# Patient Record
Sex: Male | Born: 1970 | Hispanic: No | Marital: Married | Smoking: Never smoker
Health system: Southern US, Community
[De-identification: ages and names within clinical notes are randomized; demographics above are authoritative.]

## PROBLEM LIST (undated history)

## (undated) DIAGNOSIS — E7800 Pure hypercholesterolemia, unspecified: Secondary | ICD-10-CM

## (undated) HISTORY — PX: NO PAST SURGERIES: SHX2092

## (undated) HISTORY — PX: OTHER SURGICAL HISTORY: SHX169

---

## 2009-11-25 ENCOUNTER — Encounter: Admission: RE | Admit: 2009-11-25 | Discharge: 2009-11-25 | Payer: Self-pay | Admitting: Infectious Diseases

## 2012-05-12 DIAGNOSIS — Z0000 Encounter for general adult medical examination without abnormal findings: Secondary | ICD-10-CM

## 2012-05-12 HISTORY — DX: Encounter for general adult medical examination without abnormal findings: Z00.00

## 2013-03-12 ENCOUNTER — Encounter (HOSPITAL_BASED_OUTPATIENT_CLINIC_OR_DEPARTMENT_OTHER): Payer: Self-pay | Admitting: Emergency Medicine

## 2013-03-12 ENCOUNTER — Emergency Department (HOSPITAL_BASED_OUTPATIENT_CLINIC_OR_DEPARTMENT_OTHER)
Admission: EM | Admit: 2013-03-12 | Discharge: 2013-03-12 | Disposition: A | Discharge status: Home / Self Care | Payer: 59 | Attending: Emergency Medicine | Admitting: Emergency Medicine

## 2013-03-12 ENCOUNTER — Other Ambulatory Visit: Payer: Self-pay

## 2013-03-12 ENCOUNTER — Emergency Department (HOSPITAL_BASED_OUTPATIENT_CLINIC_OR_DEPARTMENT_OTHER): Payer: 59

## 2013-03-12 DIAGNOSIS — R0789 Other chest pain: Secondary | ICD-10-CM | POA: Insufficient documentation

## 2013-03-12 DIAGNOSIS — E7800 Pure hypercholesterolemia, unspecified: Secondary | ICD-10-CM | POA: Insufficient documentation

## 2013-03-12 DIAGNOSIS — Z79899 Other long term (current) drug therapy: Secondary | ICD-10-CM | POA: Insufficient documentation

## 2013-03-12 DIAGNOSIS — R079 Chest pain, unspecified: Secondary | ICD-10-CM

## 2013-03-12 HISTORY — DX: Pure hypercholesterolemia, unspecified: E78.00

## 2013-03-12 LAB — TROPONIN I: Troponin I: <0.3 ng/mL (ref <0.30)

## 2013-03-12 NOTE — Discharge Instructions (Signed)
Chest Pain (Nonspecific) °It is often hard to give a specific diagnosis for the cause of chest pain. There is always a chance that your pain could be related to something serious, such as a heart attack or a blood clot in the lungs. You need to follow up with your caregiver for further evaluation. °CAUSES  °· Heartburn. °· Pneumonia or bronchitis. °· Anxiety or stress. °· Inflammation around your heart (pericarditis) or lung (pleuritis or pleurisy). °· A blood clot in the lung. °· A collapsed lung (pneumothorax). It can develop suddenly on its own (spontaneous pneumothorax) or from injury (trauma) to the chest. °· Shingles infection (herpes zoster virus). °The chest wall is composed of bones, muscles, and cartilage. Any of these can be the source of the pain. °· The bones can be bruised by injury. °· The muscles or cartilage can be strained by coughing or overwork. °· The cartilage can be affected by inflammation and become sore (costochondritis). °DIAGNOSIS  °Lab tests or other studies, such as X-rays, electrocardiography, stress testing, or cardiac imaging, may be needed to find the cause of your pain.  °TREATMENT  °· Treatment depends on what may be causing your chest pain. Treatment may include: °· Acid blockers for heartburn. °· Anti-inflammatory medicine. °· Pain medicine for inflammatory conditions. °· Antibiotics if an infection is present. °· You may be advised to change lifestyle habits. This includes stopping smoking and avoiding alcohol, caffeine, and chocolate. °· You may be advised to keep your head raised (elevated) when sleeping. This reduces the chance of acid going backward from your stomach into your esophagus. °· Most of the time, nonspecific chest pain will improve within 2 to 3 days with rest and mild pain medicine. °HOME CARE INSTRUCTIONS  °· If antibiotics were prescribed, take your antibiotics as directed. Finish them even if you start to feel better. °· For the next few days, avoid physical  activities that bring on chest pain. Continue physical activities as directed. °· Do not smoke. °· Avoid drinking alcohol. °· Only take over-the-counter or prescription medicine for pain, discomfort, or fever as directed by your caregiver. °· Follow your caregiver's suggestions for further testing if your chest pain does not go away. °· Keep any follow-up appointments you made. If you do not go to an appointment, you could develop lasting (chronic) problems with pain. If there is any problem keeping an appointment, you must call to reschedule. °SEEK MEDICAL CARE IF:  °· You think you are having problems from the medicine you are taking. Read your medicine instructions carefully. °· Your chest pain does not go away, even after treatment. °· You develop a rash with blisters on your chest. °SEEK IMMEDIATE MEDICAL CARE IF:  °· You have increased chest pain or pain that spreads to your arm, neck, jaw, back, or abdomen. °· You develop shortness of breath, an increasing cough, or you are coughing up blood. °· You have severe back or abdominal pain, feel nauseous, or vomit. °· You develop severe weakness, fainting, or chills. °· You have a fever. °THIS IS AN EMERGENCY. Do not wait to see if the pain will go away. Get medical help at once. Call your local emergency services (911 in [place] Do not drive yourself to the hospital. °MAKE SURE YOU:  °· Understand these instructions. °· Will watch your condition. °· Will get help right away if you are not doing well or get worse. °Document Released: 10/18/2004 Document Revised: 04/02/2011 Document Reviewed: 08/14/2007 °[employer]® Patient Information ©2014 [employer]   [employer]. ° °

## 2013-03-12 NOTE — ED Provider Notes (Signed)
CSN: [id number]     Arrival date & time 03/12/13  1231 History   First MD Initiated Contact with Patient 03/12/13 1239     Chief Complaint  Patient presents with  . Chest Pain    (Consider location/radiation/quality/duration/timing/severity/associated sxs/prior Treatment) HPI Comments: Patient is a 43 year old male with a history of hyperlipidemia who presents to the emergency department for right-sided chest pain. Patient states he has been experiencing the pain for 4 years sporadically. He states that he notices the pain mostly when he has 2 consecutive days with less than 7 hours of sleep. He states he was recently experienced the pain this morning. He describes the pain as a sharp, squeezing, pressure like pain that is nonradiating. He denies any modifying factors of his symptoms and states that his pain lasts for approximately one to 2 minutes before spontaneously resolving. Patient denies associated fever, syncope, shortness of breath, jaw pain or neck pain, nausea or vomiting, numbness/tingling, and extremity weakness. Patient denies a history of diabetes mellitus, hypertension, ACS, and PE/DVT. He endorses a history of a heart attack in his maternal grandmother at the age of 6 as well as his maternal uncle in his mid 50s.  Patient is a 43 y.o. male presenting with chest pain. The history is provided by the patient. No language interpreter was used.  Chest Pain Associated symptoms: no cough, no fever, no nausea, no numbness, no shortness of breath, not vomiting and no weakness     Past Medical History  Diagnosis Date  . Hypercholesteremia    History reviewed. No pertinent past surgical history. History reviewed. No pertinent family history. History  Substance Use Topics  . Smoking status: Never Smoker   . Smokeless tobacco: Not on file  . Alcohol Use: Not on file    Review of Systems  Constitutional: Negative for fever.  Respiratory: Negative for cough and shortness of breath.    Cardiovascular: Positive for chest pain.  Gastrointestinal: Negative for nausea and vomiting.  Neurological: Negative for weakness and numbness.  All other systems reviewed and are negative.      Allergies  Review of patient's allergies indicates no known allergies.  Home Medications   Current Outpatient Rx  Name  Route  Sig  Dispense  Refill  . simvastatin (ZOCOR) 10 MG tablet   Oral   Take 10 mg by mouth daily.          BP 144/90  Pulse 78  Temp(Src) 98.2 F (36.8 C) (Oral)  Resp 17  SpO2 100%  Physical Exam  Nursing note and vitals reviewed. Constitutional: He is oriented to person, place, and time. He appears well-developed and well-nourished. No distress.  HENT:  Head: Normocephalic and atraumatic.  Eyes: Conjunctivae and EOM are normal. No scleral icterus.  Neck: Normal range of motion. Neck supple.  Cardiovascular: Normal rate, regular rhythm, normal heart sounds and intact distal pulses.   Distal radial pulses 2+ bilaterally. No JVD.  Pulmonary/Chest: Effort normal and breath sounds normal. No respiratory distress. He has no wheezes. He has no rales.  Abdominal: Soft. He exhibits no distension. There is no tenderness. There is no rebound and no guarding.  Musculoskeletal: Normal range of motion.  Neurological: He is alert and oriented to person, place, and time.  Skin: Skin is warm and dry. No rash noted. He is not diaphoretic. No erythema. No pallor.  Psychiatric: He has a normal mood and affect. His behavior is normal.    ED Course  Procedures (including critical  care time) Labs Review Labs Reviewed  TROPONIN I   Imaging Review Dg Chest 2 View  03/12/2013   CLINICAL DATA:  Chest pain  EXAM: CHEST  2 VIEW  COMPARISON:  11/25/2009  FINDINGS: The cardiac shadow is within normal limits. The lungs are well aerated bilaterally. In the left perihilar region there is a rounded soft tissue density identified which is likely related to prominent vascularity  although the possibility of an underlying lesion cannot be totally excluded despite the patient's given age. No bony abnormality is seen. The lungs are otherwise clear.  IMPRESSION: Left perihilar density likely related to a confluence of vascular parenchymal shadows. CT of the chest with contrast would be helpful for further evaluation.   Electronically Signed   By: Alcide CleverMark  Lukens M.D.   On: 03/12/2013 13:37    EKG Interpretation   None        Date: 03/12/2013  Rate: 71  Rhythm: normal sinus rhythm  QRS Axis: normal  Intervals: normal  ST/T Wave abnormalities: nonspecific T wave changes  Conduction Disutrbances:none  Narrative Interpretation: NSR with nonspecific T wave changes  Old EKG Reviewed: none available I have personally reviewed and interpreted this EKG   MDM   Final diagnoses:  Chest pain   43 year old male presents for chest pain which has been sporadic over the last 4 years. Patient as well and nontoxic appearing, hemodynamically stable, and afebrile. Symptoms atypical for chest pain as they are right-sided and brought on mostly when patient does not get enough sleep. Cardiac workup today is negative. Doubt ACS given chronicity of symptoms, atypical nature of pain, and reassuring physical exam and workup today. Patient also with heart score of 1 correlating with low risk of major cardiac events. Doubt pulmonary embolism is patient without tachycardia, tachypnea, dyspnea, or hypoxia. X-ray shows no evidence of the external widening, pneumothorax, pleural effusion, or focal consolidation/pneumonia. It does reveal a left perihilar density; however, patient's pain is right-sided. Density thought to be related to confluence of vascular parenchymal shadows.  Patient stable and appropriate for discharge today with instruction to followup with cardiology for further evaluation of his symptoms and to schedule an outpatient stress test. Return precautions provided and patient agreeable  to plan with no unaddressed concerns.   Filed Vitals:   03/12/13 1240  BP: 144/90  Pulse: 78  Temp: 98.2 F (36.8 C)  TempSrc: Oral  Resp: 17  SpO2: 100%         Antony MaduraKelly Cabot Cromartie, PA-C 03/12/13 1441

## 2013-03-12 NOTE — ED Notes (Signed)
Pt amb to room 1 with quick steady gait in nad. Pt reports right sided chest pain "for years, about every 6 months or so..." pt states pain increases when he does not get enough sleep. Pt denies any difference in the pain today, states "I just wanted to get it looked at.Marland Kitchen.Marland Kitchen."

## 2013-03-12 NOTE — ED Notes (Signed)
Room 1 is empty, pt seen leaving er with quick steady gait in nad by Ola SpurrLori Doss, RN. Pt left before receiving or signing for his discharge papers.

## 2013-03-12 NOTE — ED Provider Notes (Signed)
Medical screening examination/treatment/procedure(s) were performed by non-physician practitioner and as supervising physician I was immediately available for consultation/collaboration.  EKG Interpretation   None         Quianna Avery, MD 03/12/13 1530 

## 2013-03-12 NOTE — ED Notes (Signed)
Pt coughs, states "there it is again...now it's gone." pt states the pain also comes "when I have to strain to use the toilet.Marland Kitchen."

## 2013-03-20 ENCOUNTER — Encounter: Payer: 59 | Admitting: Cardiovascular Disease

## 2013-04-21 ENCOUNTER — Ambulatory Visit (INDEPENDENT_AMBULATORY_CARE_PROVIDER_SITE_OTHER): Payer: 59 | Admitting: Cardiovascular Disease

## 2013-04-21 ENCOUNTER — Encounter: Payer: Self-pay | Admitting: Cardiovascular Disease

## 2013-04-21 VITALS — BP 130/80 | HR 68 | Ht 68.0 in | Wt 194.0 lb

## 2013-04-21 DIAGNOSIS — R9389 Abnormal findings on diagnostic imaging of other specified body structures: Secondary | ICD-10-CM

## 2013-04-21 DIAGNOSIS — R079 Chest pain, unspecified: Secondary | ICD-10-CM | POA: Insufficient documentation

## 2013-04-21 DIAGNOSIS — R918 Other nonspecific abnormal finding of lung field: Secondary | ICD-10-CM

## 2013-04-21 HISTORY — DX: Chest pain, unspecified: R07.9

## 2013-04-21 NOTE — Patient Instructions (Signed)
Your physician recommends that you schedule a follow-up appointment as needed with Dr. McAlhany   

## 2013-04-21 NOTE — Progress Notes (Signed)
     History of Present Illness: 43 yo male with history of HLD who is here today as a new patient for evaluation of chest pain. He was seen in the ED at [facility] 03/12/13 with c/o right sided chest pain. EKG showed NSR with non-specific T wave flattening. He tells me that he had chest pain last month with two episodes lasting for several minutes. He has had no recurrence of the chest pain over the last month. He feels well. No SOB. He does have a cough but no fevers. Present since December. CXR in the ED 03/12/13 with ? Density left lung. Read as probable confluence of vascular parenchymal shadows.   Primary Care Physician: Deneen HartsElizabeth Todd  Past Medical History  Diagnosis Date  . Hypercholesteremia     Past Surgical History  Procedure Laterality Date  . None      Current Outpatient Prescriptions  Medication Sig Dispense Refill  . simvastatin (ZOCOR) 10 MG tablet Take 10 mg by mouth daily.       No current facility-administered medications for this visit.    No Known Allergies  History   Social History  . Marital Status: Married    Spouse Name: N/A    Number of Children: 1  . Years of Education: N/A   Occupational History  . Computer work    Social History Main Topics  . Smoking status: Never Smoker   . Smokeless tobacco: Not on file  . Alcohol Use: No  . Drug Use: No  . Sexual Activity: Not on file   Other Topics Concern  . Not on file   Social History Narrative  . No narrative on file    Family History  Problem Relation Age of Onset  . Heart attack Maternal Grandmother   . Heart disease Maternal Uncle     Review of Systems:  As stated in the HPI and otherwise negative.   BP 130/80  Pulse 68  Ht 5\' 8"  (1.727 m)  Wt 194 lb (87.998 kg)  BMI 29.50 kg/m2  Physical Examination: General: Well developed, well nourished, NAD HEENT: OP clear, mucus membranes moist SKIN: warm, dry. No rashes. Neuro: No focal deficits Musculoskeletal: Muscle strength 5/5 all  ext Psychiatric: Mood and affect normal Neck: No JVD, no carotid bruits, no thyromegaly, no lymphadenopathy. Lungs:Clear bilaterally, no wheezes, rhonci, crackles Cardiovascular: Regular rate and rhythm. No murmurs, gallops or rubs. Abdomen:Soft. Bowel sounds present. Non-tender.  Extremities: No lower extremity edema. Pulses are 2 + in the bilateral DP/PT.  EKG: NSR, rate 68 bpm.   Assessment and Plan:   1. Chest pain: Atypical and resolved. Non-cardiac pain. EKG normal. No further workup.   2. Abnormal chest x-ray in ED: Will need f/u CXR per primary care. Also f/u in primary care for cough.

## 2014-07-03 ENCOUNTER — Emergency Department (HOSPITAL_BASED_OUTPATIENT_CLINIC_OR_DEPARTMENT_OTHER): Payer: 59

## 2014-07-03 ENCOUNTER — Emergency Department (HOSPITAL_BASED_OUTPATIENT_CLINIC_OR_DEPARTMENT_OTHER)
Admission: EM | Admit: 2014-07-03 | Discharge: 2014-07-03 | Disposition: A | Discharge status: Home / Self Care | Payer: 59 | Attending: Emergency Medicine | Admitting: Emergency Medicine

## 2014-07-03 ENCOUNTER — Other Ambulatory Visit: Payer: Self-pay

## 2014-07-03 ENCOUNTER — Encounter (HOSPITAL_BASED_OUTPATIENT_CLINIC_OR_DEPARTMENT_OTHER): Payer: Self-pay | Admitting: *Deleted

## 2014-07-03 DIAGNOSIS — E780 Pure hypercholesterolemia: Secondary | ICD-10-CM | POA: Diagnosis not present

## 2014-07-03 DIAGNOSIS — Y9289 Other specified places as the place of occurrence of the external cause: Secondary | ICD-10-CM | POA: Insufficient documentation

## 2014-07-03 DIAGNOSIS — X58XXXA Exposure to other specified factors, initial encounter: Secondary | ICD-10-CM | POA: Insufficient documentation

## 2014-07-03 DIAGNOSIS — S29012A Strain of muscle and tendon of back wall of thorax, initial encounter: Secondary | ICD-10-CM | POA: Insufficient documentation

## 2014-07-03 DIAGNOSIS — Y998 Other external cause status: Secondary | ICD-10-CM | POA: Diagnosis not present

## 2014-07-03 DIAGNOSIS — S239XXA Sprain of unspecified parts of thorax, initial encounter: Secondary | ICD-10-CM | POA: Insufficient documentation

## 2014-07-03 DIAGNOSIS — Z79899 Other long term (current) drug therapy: Secondary | ICD-10-CM | POA: Diagnosis not present

## 2014-07-03 DIAGNOSIS — S299XXA Unspecified injury of thorax, initial encounter: Secondary | ICD-10-CM | POA: Diagnosis present

## 2014-07-03 DIAGNOSIS — S233XXA Sprain of ligaments of thoracic spine, initial encounter: Secondary | ICD-10-CM

## 2014-07-03 DIAGNOSIS — Y9389 Activity, other specified: Secondary | ICD-10-CM | POA: Insufficient documentation

## 2014-07-03 DIAGNOSIS — R52 Pain, unspecified: Secondary | ICD-10-CM

## 2014-07-03 LAB — BASIC METABOLIC PANEL
Anion gap: 3 — ABNORMAL LOW (ref 5–15)
BUN: 14 mg/dL (ref 6–20)
CHLORIDE: 108 mmol/L (ref 101–111)
CO2: 27 mmol/L (ref 22–32)
Calcium: 8.5 mg/dL — ABNORMAL LOW (ref 8.9–10.3)
Creatinine, Ser: 0.96 mg/dL (ref 0.61–1.24)
GFR calc Af Amer: >60 mL/min (ref >60)
GLUCOSE: 102 mg/dL — AB (ref 65–99)
Potassium: 3.9 mmol/L (ref 3.5–5.1)
Sodium: 138 mmol/L (ref 135–145)

## 2014-07-03 LAB — CBC WITH DIFFERENTIAL/PLATELET
Basophils Absolute: 0.1 10*3/uL (ref 0.0–0.1)
Basophils Relative: 1 % (ref 0–1)
EOS ABS: 0.4 10*3/uL (ref 0.0–0.7)
EOS PCT: 4 % (ref 0–5)
HCT: 44.3 % (ref 39.0–52.0)
HEMOGLOBIN: 14.8 g/dL (ref 13.0–17.0)
LYMPHS ABS: 3.6 10*3/uL (ref 0.7–4.0)
Lymphocytes Relative: 35 % (ref 12–46)
MCH: 27.3 pg (ref 26.0–34.0)
MCHC: 33.4 g/dL (ref 30.0–36.0)
MCV: 81.6 fL (ref 78.0–100.0)
MONO ABS: 1 10*3/uL (ref 0.1–1.0)
MONOS PCT: 10 % (ref 3–12)
NEUTROS ABS: 5.3 10*3/uL (ref 1.7–7.7)
Neutrophils Relative %: 51 % (ref 43–77)
PLATELETS: 210 10*3/uL (ref 150–400)
RBC: 5.43 MIL/uL (ref 4.22–5.81)
RDW: 13 % (ref 11.5–15.5)
WBC: 10.4 10*3/uL (ref 4.0–10.5)

## 2014-07-03 LAB — TROPONIN I

## 2014-07-03 MED ORDER — IBUPROFEN 800 MG PO TABS
800.0000 mg | ORAL_TABLET | Freq: Once | ORAL | Status: AC
  Administered 2014-07-03: 800 mg via ORAL
  Filled 2014-07-03: qty 1

## 2014-07-03 MED ORDER — METHOCARBAMOL 500 MG PO TABS: 1000.0000 mg | ORAL_TABLET | Freq: Four times a day (QID) | ORAL | Status: DC | PRN

## 2014-07-03 NOTE — ED Notes (Signed)
PA back at bedside for disposition 

## 2014-07-03 NOTE — ED Notes (Signed)
Patient c/o back pain that extends to chest that started 30 minutes ago when twisting his upper torso, hurts to twist upper body

## 2014-07-03 NOTE — Discharge Instructions (Signed)
For pain control you may take up to 800mg of Motrin (also known as ibuprofen). That is usually 4 over the counter pills,  3 times a day. Take with food to minimize stomach irritation   ° °For breakthrough pain you may take Robaxin. Do not drink alcohol, drive or operate heavy machinery when taking Robaxin. ° °Please follow with your primary care doctor in the next 2 days for a check-up. They must obtain records for further management.  ° °Do not hesitate to return to the Emergency Department for any new, worsening or concerning symptoms.  ° ° °

## 2014-07-03 NOTE — ED Provider Notes (Signed)
CSN: [id number]     Arrival date & time 07/03/14  1459 History   First MD Initiated Contact with Patient 07/03/14 1513     Chief Complaint  Patient presents with  . Back Pain     (Consider location/radiation/quality/duration/timing/severity/associated sxs/prior Treatment) HPI   Tom Hamilton is a 44 y.o. male past medical history significant for high cholesterol complaining of acute onset of left thoracic back pain onset when patient was twisting putting food into a microwave, he states that the pain happened approximately 30 minutes prior to arrival, he rates it at 4 out of 10, states it's exacerbated by movement and sitting. Pain radiates around to the left anterior chest. No pain medication taken prior to arrival. He denies associated symptoms of shortness of breath, N/V, diaphoresis, history of tobacco use, diabetes, hypertension. Patient states that he is concerned that he is having a heart attack. +FH of ACS.   Past Medical History  Diagnosis Date  . Hypercholesteremia    Past Surgical History  Procedure Laterality Date  . None     Family History  Problem Relation Age of Onset  . Heart attack Maternal Grandmother   . Heart disease Maternal Uncle    History  Substance Use Topics  . Smoking status: Never Smoker   . Smokeless tobacco: Not on file  . Alcohol Use: No    Review of Systems  10 systems reviewed and found to be negative, except as noted in the HPI.  Allergies  Review of patient's allergies indicates no known allergies.  Home Medications   Prior to Admission medications   Medication Sig Start Date End Date Taking? Authorizing Provider  MELOXICAM PO Take by mouth.   Yes Historical Provider, MD  methocarbamol (ROBAXIN) 500 MG tablet Take 2 tablets (1,000 mg total) by mouth 4 (four) times daily as needed (Pain). 07/03/14   Leiya Keesey, PA-C  simvastatin (ZOCOR) 10 MG tablet Take 10 mg by mouth daily.    Historical Provider, MD   BP 116/77 mmHg   Pulse 67  Temp(Src) 98.2 F (36.8 C) (Oral)  Resp 18  Ht  (1.727 m)  Wt 200 lb (90.719 kg)  BMI 30.42 kg/m2  SpO2 98% Physical Exam  Constitutional: He is oriented to person, place, and time. He appears well-developed and well-nourished. No distress.  HENT:  Head: Normocephalic and atraumatic.  Mouth/Throat: Oropharynx is clear and moist.  Eyes: Conjunctivae and EOM are normal. Pupils are equal, round, and reactive to light.  Neck: Normal range of motion.  Cardiovascular: Normal rate, regular rhythm and intact distal pulses.   Pulmonary/Chest: Effort normal and breath sounds normal. No stridor. No respiratory distress. He has no wheezes. He has no rales. He exhibits no tenderness.  Abdominal: Soft. Bowel sounds are normal. There is no tenderness.  Musculoskeletal: Normal range of motion.  Full range of motion to shoulder. Patient is mildly diffusely tender to palpation along the left posterior thorax. Lung sounds are clear to auscultation bilaterally.  Neurological: He is alert and oriented to person, place, and time.  Psychiatric: He has a normal mood and affect.  Nursing note and vitals reviewed.   ED Course  Procedures (including critical care time) Labs Review Labs Reviewed  BASIC METABOLIC PANEL - Abnormal; Notable for the following:    Glucose, Bld 102 (*)    Calcium 8.5 (*)    Anion gap 3 (*)    All other components within normal limits  CBC WITH DIFFERENTIAL/PLATELET  TROPONIN I  Imaging Review Dg Chest 2 View  07/03/2014   CLINICAL DATA:  Twisted upper back this afternoon.  Upper back pain.  EXAM: CHEST  2 VIEW  COMPARISON:  03/12/2013, 11/25/2009  FINDINGS: The heart size and mediastinal contours are within normal limits. Both lungs are clear. The visualized skeletal structures are unremarkable.  IMPRESSION: No active cardiopulmonary disease.   Electronically Signed   By: Elige Ko   On: 07/03/2014 15:36     EKG Interpretation   Date/Time:  Saturday  July 03 2014 15:59:26 EDT Ventricular Rate:  62 PR Interval:  138 QRS Duration: 88 QT Interval:  408 QTC Calculation: 414 R Axis:   48 Text Interpretation:  Normal sinus rhythm Nonspecific T wave abnormality  Abnormal ECG no change from prior Confirmed by Donnald Garre, MD, Lebron Conners [phone])  on 07/03/2014 4:02:12 PM      MDM   Final diagnoses:  Pain  Thoracic sprain and strain, initial encounter    Filed Vitals:   07/03/14 1504  BP: 116/77  Pulse: 67  Temp: 98.2 F (36.8 C)  TempSrc: Oral  Resp: 18  Height: 5\' 8"  (1.727 m)  Weight: 200 lb (90.719 kg)  SpO2: 98%    Medications  ibuprofen (ADVIL,MOTRIN) tablet 800 mg (800 mg Oral Given 07/03/14 1543)    Tom Hamilton is a pleasant 44 y.o. male presenting with left thoracic back pain after patient twisted just prior to arrival. The pain radiates around to the anterior chest. Pain is exacerbated by movement palpation sitting in certain positions. This is clearly a musculoskeletal pain. Patient is concerned that he is having a stroke, with had an extensive discussion of the common symptoms associated with stroke. I'm more concerned about a possible intrathoracic issue. Patient is asking for testing, I think having a chest x-ray is benign. Chest x-ray shows no mediastinum or other issues. I don't think she warrants head CT or extended ACS workup. Patient is low risk by heart score. On further discussion with this patient I find that he is concerned about heart attack rather than stroke, he was getting the words confused. Patient has positive family history and seems nervous, like this is unlikely think is reasonable to obtain EKG and blood work.  EKG with nonspecific T-wave changes, unchanged from prior. Troponin is negative. Blood work otherwise unremarkable.  Discussed case with attending MD who agrees with plan and stability to d/c to home.   Evaluation does not show pathology that would require ongoing emergent intervention or  inpatient treatment. Pt is hemodynamically stable and mentating appropriately. Discussed findings and plan with patient/guardian, who agrees with care plan. All questions answered. Return precautions discussed and outpatient follow up given.   New Prescriptions   METHOCARBAMOL (ROBAXIN) 500 MG TABLET    Take 2 tablets (1,000 mg total) by mouth 4 (four) times daily as needed (Pain).         Wynetta Emery, PA-C 07/03/14 1648  Arby Barrette, MD 07/03/14 [phone]

## 2015-01-19 DIAGNOSIS — B356 Tinea cruris: Secondary | ICD-10-CM

## 2015-01-19 DIAGNOSIS — M25562 Pain in left knee: Secondary | ICD-10-CM

## 2015-01-19 DIAGNOSIS — E7800 Pure hypercholesterolemia, unspecified: Secondary | ICD-10-CM

## 2015-01-19 HISTORY — DX: Tinea cruris: B35.6

## 2015-01-19 HISTORY — DX: Pain in left knee: M25.562

## 2015-01-19 HISTORY — DX: Pure hypercholesterolemia, unspecified: E78.00

## 2015-05-05 DIAGNOSIS — M2242 Chondromalacia patellae, left knee: Secondary | ICD-10-CM

## 2015-05-05 HISTORY — DX: Chondromalacia patellae, left knee: M22.42

## 2015-11-09 DIAGNOSIS — Z23 Encounter for immunization: Secondary | ICD-10-CM

## 2015-11-09 HISTORY — DX: Encounter for immunization: Z23

## 2017-09-12 DIAGNOSIS — M25561 Pain in right knee: Secondary | ICD-10-CM

## 2017-09-12 HISTORY — DX: Pain in right knee: M25.561

## 2018-01-16 DIAGNOSIS — S83249A Other tear of medial meniscus, current injury, unspecified knee, initial encounter: Secondary | ICD-10-CM

## 2018-01-16 HISTORY — DX: Other tear of medial meniscus, current injury, unspecified knee, initial encounter: S83.249A

## 2018-03-27 DIAGNOSIS — M545 Low back pain, unspecified: Secondary | ICD-10-CM

## 2018-03-27 HISTORY — DX: Low back pain, unspecified: M54.50

## 2019-04-05 ENCOUNTER — Other Ambulatory Visit: Payer: Self-pay

## 2019-04-05 ENCOUNTER — Encounter (HOSPITAL_COMMUNITY): Payer: Self-pay

## 2019-04-05 ENCOUNTER — Ambulatory Visit (HOSPITAL_COMMUNITY)
Admission: EM | Admit: 2019-04-05 | Discharge: 2019-04-05 | Disposition: A | Discharge status: Home / Self Care | Payer: 59 | Attending: Family Medicine | Admitting: Family Medicine

## 2019-04-05 DIAGNOSIS — K047 Periapical abscess without sinus: Secondary | ICD-10-CM | POA: Diagnosis not present

## 2019-04-05 MED ORDER — HYDROCODONE-ACETAMINOPHEN 5-325 MG PO TABS: 1.0000 | ORAL_TABLET | Freq: Four times a day (QID) | ORAL | 0 refills | Status: DC | PRN

## 2019-04-05 MED ORDER — AMOXICILLIN 875 MG PO TABS: 875.0000 mg | ORAL_TABLET | Freq: Two times a day (BID) | ORAL | 0 refills | Status: AC

## 2019-04-05 NOTE — ED Provider Notes (Signed)
[place]    CSN: [id number] Arrival date & time: 04/05/19  1554      History   Chief Complaint Chief Complaint  Patient presents with  . Dental Pain    HPI Tom Hamilton is a 50 y.o. male.   49 yo man making initial [place] patient visit.  He complains of a dental problem.  He had the dental work done February 25 and 3 days ago started having pain in the upper molar tooth #14.  Patient has tried ibuprofen but has not controlled the pain.  Is also tried cold ice water which helps some.  He has an appointment on the 18th but is going to call tomorrow to move the appointment up.     Past Medical History:  Diagnosis Date  . Hypercholesteremia     Patient Active Problem List   Diagnosis Date Noted  . Chest pain 04/21/2013    Past Surgical History:  Procedure Laterality Date  . None         Home Medications    Prior to Admission medications   Medication Sig Start Date End Date Taking? Authorizing Provider  amoxicillin (AMOXIL) 875 MG tablet Take 1 tablet (875 mg total) by mouth 2 (two) times daily. 04/05/19   Robyn Haber, MD  HYDROcodone-acetaminophen (NORCO) 5-325 MG tablet Take 1 tablet by mouth every 6 (six) hours as needed for moderate pain. 04/05/19   Robyn Haber, MD  MELOXICAM PO Take by mouth.    [provider]  methocarbamol (ROBAXIN) 500 MG tablet Take 2 tablets (1,000 mg total) by mouth 4 (four) times daily as needed (Pain). 07/03/14   Pisciotta, Elmyra Ricks, PA-C  simvastatin (ZOCOR) 10 MG tablet Take 10 mg by mouth daily.    [provider]    Family History Family History  Problem Relation Age of Onset  . Heart attack Maternal Grandmother   . Heart disease Maternal Uncle     Social History Social History   Tobacco Use  . Smoking status: Never Smoker  . Smokeless tobacco: Never Used  Substance Use Topics  . Alcohol use: No  . Drug use: No     Allergies   Patient has no known allergies.   Review  of Systems Review of Systems  HENT: Positive for dental problem.      Physical Exam Triage Vital Signs ED Triage Vitals  Enc Vitals Group     BP      Pulse      Resp      Temp      Temp src      SpO2      Weight      Height      Head Circumference      Peak Flow      Pain Score      Pain Loc      Pain Edu?      Excl. in [place]?    No data found.  Updated Vital Signs BP 120/82 (BP Location: Right Arm)   Pulse 78   Temp 97.8 F (36.6 C) (Oral)   Resp 18   Wt 81.6 kg   SpO2 98%   BMI 27.37 kg/m    Physical Exam Vitals and nursing note reviewed.  Constitutional:      Appearance: Normal appearance. He is normal weight.  HENT:     Mouth/Throat:     Comments: Mild swelling around tooth #14 Pulmonary:     Effort: Pulmonary effort is normal.  Musculoskeletal:        General: Normal range of motion.     Cervical back: Normal range of motion and neck supple.  Lymphadenopathy:     Cervical: No cervical adenopathy.  Skin:    General: Skin is warm and dry.  Neurological:     General: No focal deficit present.     Mental Status: He is alert and oriented to person, place, and time.  Psychiatric:        Mood and Affect: Mood normal.        Behavior: Behavior normal.        Thought Content: Thought content normal.        Judgment: Judgment normal.      UC Treatments / Results  Labs (all labs ordered are listed, but only abnormal results are displayed) Labs Reviewed - No data to display  EKG   Radiology No results found.  Procedures Procedures (including critical care time)  Medications Ordered in UC Medications - No data to display  Initial Impression / Assessment and Plan / UC Course  I have reviewed the triage vital signs and the nursing notes.  Pertinent labs & imaging results that were available during my care of the patient were reviewed by me and considered in my medical decision making (see chart for details).    Final Clinical Impressions(s)  / UC Diagnoses   Final diagnoses:  Dental infection     Discharge Instructions     Try to call dentist in the morning to move up the appointment.    ED Prescriptions    Medication Sig Dispense Auth. Provider   amoxicillin (AMOXIL) 875 MG tablet Take 1 tablet (875 mg total) by mouth 2 (two) times daily. 20 tablet Elvina Sidle, MD   HYDROcodone-acetaminophen (NORCO) 5-325 MG tablet Take 1 tablet by mouth every 6 (six) hours as needed for moderate pain. 12 tablet Elvina Sidle, MD     I have reviewed the PDMP during this encounter.   Elvina Sidle, MD 04/05/19 [phone]

## 2019-04-05 NOTE — ED Triage Notes (Signed)
Pt state he has toothache. X 2 days

## 2019-04-05 NOTE — Discharge Instructions (Addendum)
Try to call dentist in the morning to move up the appointment.

## 2020-03-22 ENCOUNTER — Telehealth: Payer: Self-pay

## 2020-03-22 NOTE — Telephone Encounter (Signed)
NOTES ON FILE FROM [facility] [phone], SENT REFERRAL TO SCHEDULING °

## 2020-04-05 ENCOUNTER — Encounter: Payer: Self-pay | Admitting: Cardiology

## 2020-04-05 ENCOUNTER — Other Ambulatory Visit: Payer: Self-pay

## 2020-04-05 ENCOUNTER — Ambulatory Visit: Payer: 59 | Admitting: Cardiology

## 2020-04-05 VITALS — BP 118/62 | HR 79 | Ht 68.0 in | Wt 182.1 lb

## 2020-04-05 DIAGNOSIS — R9431 Abnormal electrocardiogram [ECG] [EKG]: Secondary | ICD-10-CM | POA: Diagnosis not present

## 2020-04-05 DIAGNOSIS — E7800 Pure hypercholesterolemia, unspecified: Secondary | ICD-10-CM | POA: Diagnosis not present

## 2020-04-05 DIAGNOSIS — R079 Chest pain, unspecified: Secondary | ICD-10-CM | POA: Diagnosis not present

## 2020-04-05 NOTE — Progress Notes (Signed)
Cardiology Office Note:    Date:  04/05/2020   ID:  Tom Hamilton, DOB [birth year], MRN [id number]  PCP:  Deneen Harts, FNP  Cardiologist:  Garwin Brothers, MD   Referring MD: Roger Kill, *    ASSESSMENT:    1. Chest pain, unspecified type   2. Hypercholesterolemia   3. Abnormal electrocardiogram (ECG) (EKG)    PLAN:    In order of problems listed above:  1. Primary prevention stressed with the patient.  Importance of compliance with diet medication stressed any vocalized understanding.  He was advised to continue his exercise.  He walks at least half an hour a day on a regular basis. 2. Chest pain/shoulder pain: He is a little concerned about his shoulder pain symptoms.  This is very atypical for coronary etiology.  I discussed this with him at length.  He walks without any symptoms.  To reassure him I will do a stress echo.  He does not want to schedule it at this time.  Benefits risks explained and he understands and he will call us back to reschedule it. 3. Dyslipidemia: Diet was discussed with the patient at length.  Lipids followed by primary care provider. 4. Coronary risk stratification: In view of family history I discussed calcium scoring and is agreeable.  Again he wants to call us back and reschedule it. 5. Patient will be seen in follow-up appointment in 6 months or earlier if the patient has any concerns.  He knows to go to the nearest emergency room for any concerning symptoms.   Medication Adjustments/Labs and Tests Ordered: Current medicines are reviewed at length with the patient today.  Concerns regarding medicines are outlined above.  Orders Placed This Encounter  Procedures  . CT CARDIAC SCORING (SELF PAY ONLY)  . EKG 12-Lead  . ECHOCARDIOGRAM STRESS TEST   No orders of the defined types were placed in this encounter.    History of Present Illness:    Tom Hamilton is a 50 y.o. male who is being seen today for the evaluation of  chest discomfort at the request of Mayford Knife, Breejante J, *.  Patient is a pleasant 50 year old male.  He has past medical history of mixed dyslipidemia.  He is an active gentleman.  He walks some on a regular basis at least 30 minutes without any symptoms.  He has been experiencing right shoulder pain because of his posture.  No orthopnea or PND.  He works in a computer all day long.  At the time of my evaluation, the patient is alert awake oriented and in no distress.  No substernal pain no radiation to the neck or to the arm.  At the time of my evaluation, the patient is alert awake oriented and in no distress.  Past Medical History:  Diagnosis Date  . Chest pain 04/21/2013  . 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 Encounter for general adult medical examination without abnormal findings 05/12/2012  . Hypercholesteremia   . Hypercholesterolemia 01/19/2015  . Left knee pain 01/19/2015  . Low back pain 03/27/2018  . Needs flu shot 11/09/2015  . Pain in right knee 09/12/2017  . Tear of medial meniscus of knee 01/16/2018  . Tinea cruris 01/19/2015    Past Surgical History:  Procedure Laterality Date  . NO PAST SURGERIES      Current Medications: Current Meds  Medication Sig  . amoxicillin (AMOXIL) 875 MG tablet Take 1 tablet (875 mg total) by mouth 2 (two) times daily.  Marland Kitchen  atorvastatin (LIPITOR) 20 MG tablet Take 20 mg by mouth daily.     Allergies:   Patient has no known allergies.   Social History   Socioeconomic History  . Marital status: Married    Spouse name: Not on file  . Number of children: 1  . Years of education: Not on file  . Highest education level: Not on file  Occupational History  . Occupation: Animator work    Associate Professor: [employer]  Tobacco Use  . Smoking status: Never Smoker  . Smokeless tobacco: Never Used  Substance and Sexual Activity  . Alcohol use: No  . Drug use: No  . Sexual activity: Not on file  Other Topics Concern  . Not on file  Social  History Narrative  . Not on file   Social Determinants of Health   Financial Resource Strain: Not on file  Food Insecurity: Not on file  Transportation Needs: Not on file  Physical Activity: Not on file  Stress: Not on file  Social Connections: Not on file     Family History: The patient's family history includes Heart attack in his maternal grandmother; Heart disease in his maternal uncle.  ROS:   Please see the history of present illness.    All other systems reviewed and are negative.  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   Recent Labs: No results found for requested labs within last 8760 hours.  Recent Lipid Panel No results found for: CHOL, TRIG, HDL, CHOLHDL, VLDL, LDLCALC, LDLDIRECT  Physical Exam:    VS:  BP 118/62   Pulse 79   Ht 5\' 8"  (1.727 m)   Wt 182 lb 1.3 oz (82.6 kg)   SpO2 96%   BMI 27.69 kg/m     Wt Readings from Last 3 Encounters:  04/05/20 182 lb 1.3 oz (82.6 kg)  04/05/19 180 lb (81.6 kg)  07/03/14 200 lb (90.7 kg)     GEN: Patient is in no acute distress HEENT: Normal NECK: No JVD; No carotid bruits LYMPHATICS: No lymphadenopathy CARDIAC: S1 S2 regular, 2/6 systolic murmur at the apex. RESPIRATORY:  Clear to auscultation without rales, wheezing or rhonchi  ABDOMEN: Soft, non-tender, non-distended MUSCULOSKELETAL:  No edema; No deformity  SKIN: Warm and dry NEUROLOGIC:  Alert and oriented x 3 PSYCHIATRIC:  Normal affect    Signed, 09/02/14, MD  04/05/2020 4:46 PM    [facility]

## 2020-04-05 NOTE — Patient Instructions (Signed)
Medication Instructions:  No medication changes. *If you need a refill on your cardiac medications before your next appointment, please call your pharmacy*   Lab Work: None ordered If you have labs (blood work) drawn today and your tests are completely normal, you will receive your results only by: Marland Kitchen MyChart Message (if you have MyChart) OR . A paper copy in the mail If you have any lab test that is abnormal or we need to change your treatment, we will call you to review the results.   Testing/Procedures:  We will order CT coronary calcium score. It will cost $99.00 and is not covered by insurance.  Please call [phone] to schedule.   [facility]  [address]  [place], [place] [address]      Stress Echocardiogram Information Sheet                                                      Instructions:    1. You may take your morning medications the morning of the test  2. Light breakfast no caffeine  3. Dress prepared to exercise.  4. DO NOT use ANY caffeine or tobacco products 3 hours before appointment.  5. Please bring all current prescription medications.     Follow-Up: At [facility], you and your health needs are our priority.  As part of our continuing mission to provide you with exceptional heart care, we have created designated Provider Care Teams.  These Care Teams include your primary Cardiologist (physician) and Advanced Practice Providers (APPs -  Physician Assistants and Nurse Practitioners) who all work together to provide you with the care you need, when you need it.  We recommend signing up for the patient portal called "MyChart".  Sign up information is provided on this After Visit Summary.  MyChart is used to connect with patients for Virtual Visits (Telemedicine).  Patients are able to view lab/test results, encounter notes, upcoming appointments, etc.  Non-urgent messages can be sent to your provider as well.   To learn more about what  you can do with MyChart, go to [email or web address].    Your next appointment:   6 month(s)  The format for your next appointment:   In Person  Provider:   Belva Crome, MD   Other Instructions  Coronary Calcium Scan A coronary calcium scan is an imaging test used to look for deposits of plaque in the inner lining of the blood vessels of the heart (coronary arteries). Plaque is made up of calcium, protein, and fatty substances. These deposits of plaque can partly clog and narrow the coronary arteries without producing any symptoms or warning signs. This puts a person at risk for a heart attack. This test is recommended for people who are at moderate risk for heart disease. The test can find plaque deposits before symptoms develop. Tell a health care provider about:  Any allergies you have.  All medicines you are taking, including vitamins, herbs, eye drops, creams, and over-the-counter medicines.  Any problems you or family members have had with anesthetic medicines.  Any blood disorders you have.  Any surgeries you have had.  Any medical conditions you have.  Whether you are pregnant or may be pregnant. What are the risks? Generally, this is a safe procedure. However, problems may occur, including:  Harm  to a pregnant woman and her unborn baby. This test involves the use of radiation. Radiation exposure can be dangerous to a pregnant woman and her unborn baby. If you are pregnant or think you may be pregnant, you should not have this procedure done.  Slight increase in the risk of cancer. This is because of the radiation involved in the test. What happens before the procedure? Ask your health care provider for any specific instructions on how to prepare for this procedure. You may be asked to avoid products that contain caffeine, tobacco, or nicotine for 4 hours before the procedure. What happens during the procedure?  You will undress and remove any jewelry from your  neck or chest.  You will put on a hospital gown.  Sticky electrodes will be placed on your chest. The electrodes will be connected to an electrocardiogram (ECG) machine to record a tracing of the electrical activity of your heart.  You will lie down on a curved bed that is attached to the CT scanner.  You may be given medicine to slow down your heart rate so that clear pictures can be created.  You will be moved into the CT scanner, and the CT scanner will take pictures of your heart. During this time, you will be asked to lie still and hold your breath for 2-3 seconds at a time while each picture of your heart is being taken. The procedure may vary among health care providers and hospitals.    What happens after the procedure?  You can get dressed.  You can return to your normal activities.  It is up to you to get the results of your procedure. Ask your health care provider, or the department that is doing the procedure, when your results will be ready. Summary  A coronary calcium scan is an imaging test used to look for deposits of plaque in the inner lining of the blood vessels of the heart (coronary arteries). Plaque is made up of calcium, protein, and fatty substances.  Generally, this is a safe procedure. Tell your health care provider if you are pregnant or may be pregnant.  Ask your health care provider for any specific instructions on how to prepare for this procedure.  A CT scanner will take pictures of your heart.  You can return to your normal activities after the scan is done. This information is not intended to replace advice given to you by your health care provider. Make sure you discuss any questions you have with your health care provider. Document Revised: 07/29/2018 Document Reviewed: 07/29/2018 Elsevier Patient Education  2021 Elsevier Inc.   Exercise Stress Echocardiogram An exercise stress echocardiogram is a test to check how well your heart is working.  This test uses sound waves and a computer to make pictures of your heart. These pictures will be taken before and after you exercise. For this test, you will walk on a treadmill or ride a bicycle to make your heart beat faster. While you exercise, your heart will be checked with an electrocardiogram (ECG). Your blood pressure will also be checked. You may have this test if:  You have chest pain or a heart problem.  You had a heart attack or heart surgery not long ago.  You have heart valve problems.  You have a condition that causes narrowing of the blood vessels that supply your heart.  You have a high risk of heart disease and: ? You are starting a new exercise program. ? You  need to have a big surgery. Tell a doctor about:  Any allergies you have.  All medicines you are taking. This includes vitamins, herbs, eye drops, creams, and over-the-counter medicines.  Any problems you or family members have had with medicines that make you fall asleep (anesthetic medicines).  Any surgeries you have had.  Any blood disorders you have.  Any medical conditions you have.  Whether you are pregnant or may be pregnant. What are the risks? Generally, this is a safe test. However, problems may occur, including:  Chest pain.  Feeling dizzy or light-headed.  Shortness of breath.  Increased or irregular heartbeat.  Feeling like you may vomit (nausea) or vomiting.  Heart attack. This is very rare. What happens before the test? Medicines  Ask your doctor about changing or stopping your normal medicines. This is important if you take diabetes medicines or blood thinners.  If you use an inhaler, bring it to the test. General instructions  Wear comfortable clothes and walking shoes.  Follow instructions from your doctor about what you cannot eat or drink before the test.  Do not drink or eat anything that has caffeine in it. Stop having caffeine 24 hours before the test.  Do not  smoke or use products that contain nicotine or tobacco for 4 hours before the test. If you need help quitting, ask your doctor. What happens during the test?  You will take off your clothes from the waist up and put on a hospital gown.  Electrodes or patches will be put on your chest.  A blood pressure cuff will be put on your arm.  Before you exercise, a computer will make a picture of your heart. To do this: ? You will lie down and a gel will be put on your chest. ? A wand will be moved over the gel. ? Sound waves from the wand will go to the computer to make the picture.  Then, you will start to exercise. You may walk on a treadmill or pedal a bicycle.  Your blood pressure and heart rhythm will be checked while you exercise.  The exercise will get harder or faster.  You will exercise until: ? Your heart reaches a certain level. ? You are too tired to go on. ? You cannot go on because of chest pain, weakness, or dizziness.  You will lie down right away so another picture of your heart can be taken. The procedure may vary among doctors and hospitals.   What can I expect after the test?  After your test, it is common to have: ? Mild soreness. ? Mild tiredness. Your heart rate and blood pressure will be checked until they return to your normal levels. You should not have any new symptoms after this test. Follow these instructions at home:  If your doctor says that you can, you may: ? Eat what you normally eat. ? Do your normal activities.  Take over-the-counter and prescription medicines only as told by your doctor.  Keep all follow-up visits.  It is up to you to get the results of your test. Ask how to get your results when they are ready. Contact a doctor if:  You feel dizzy or light-headed.  You have a fast or irregular heartbeat.  You feel like you may vomit or you vomit.  You have a headache.  You feel short of breath. Get help right away if:  You develop  pain or pressure: ? In your chest. ? In your  jaw or neck. ? Between your shoulders. ? That goes down your left arm.  You faint.  You have trouble breathing. These symptoms may be an emergency. Get medical help right away. Call your local emergency services (911 in the [place]  Do not wait to see if the symptoms will go away.  Do not drive yourself to the hospital. Summary  This is a test that checks how well your heart is working.  Follow instructions about what you cannot eat or drink before the test. Ask your doctor if you should take your normal medicines before the test.  Stop having caffeine 24 hours before the test.  Do not smoke or use products with nicotine or tobacco in them for 4 hours before the test.  During the test, your blood pressure and heart rhythm will be checked while you exercise. This information is not intended to replace advice given to you by your health care provider. Make sure you discuss any questions you have with your health care provider. Document Revised: 09/01/2019 Document Reviewed: 09/01/2019 Elsevier Patient Education  2021 [employer]

## 2020-04-06 ENCOUNTER — Telehealth (HOSPITAL_COMMUNITY): Payer: Self-pay | Admitting: Cardiology

## 2020-04-06 NOTE — Telephone Encounter (Signed)
I called patient to schedule Stress echocardiogram and patient states he is going out of town and did not wish to schedule at this time and will call me at a later date to reschedule.  Order will be removed from the WQ and when patient calls back we will reinstate the order. Thank you.

## 2020-09-22 NOTE — Progress Notes (Signed)
09/23/20- 50 yoM never smoker for sleep evaluation courtesy of Dr Vivi Ferns with concern of snoring Medical problem list includes Hypercholesterolemia Epworth score- Body weight today- Covid vax-

## 2020-09-23 ENCOUNTER — Encounter: Payer: 59 | Admitting: Internal Medicine

## 2020-12-12 ENCOUNTER — Telehealth: Payer: Self-pay | Admitting: Cardiology

## 2020-12-12 NOTE — Telephone Encounter (Signed)
Patient was requesting a receipt from his office visit 04/05/20 with Dr. Tomie China emailed to him if possible.Please send to [email or web address].  If e-mail is not possible, please mail paper letter to address in chart

## 2021-01-05 ENCOUNTER — Ambulatory Visit (INDEPENDENT_AMBULATORY_CARE_PROVIDER_SITE_OTHER)
Admission: RE | Admit: 2021-01-05 | Discharge: 2021-01-05 | Disposition: A | Discharge status: Home / Self Care | Payer: Self-pay | Source: Ambulatory Visit | Attending: Cardiology | Admitting: Cardiology

## 2021-01-05 ENCOUNTER — Other Ambulatory Visit: Payer: Self-pay

## 2021-01-05 DIAGNOSIS — R079 Chest pain, unspecified: Secondary | ICD-10-CM

## 2021-01-06 ENCOUNTER — Telehealth: Payer: Self-pay

## 2021-01-06 NOTE — Telephone Encounter (Signed)
-----   Message from Georgeanna Lea, MD sent at 01/06/2021 10:43 AM EST ----- Calcium score is 0, very low chances for coronary artery disease

## 2021-01-06 NOTE — Telephone Encounter (Signed)
Patient notified of results.

## 2021-09-21 ENCOUNTER — Other Ambulatory Visit: Payer: Self-pay | Admitting: Family Medicine

## 2021-09-21 ENCOUNTER — Ambulatory Visit
Admission: RE | Admit: 2021-09-21 | Discharge: 2021-09-21 | Disposition: A | Discharge status: Home / Self Care | Payer: BC Managed Care – PPO | Source: Ambulatory Visit | Attending: Family Medicine | Admitting: Family Medicine

## 2021-09-21 DIAGNOSIS — R7611 Nonspecific reaction to tuberculin skin test without active tuberculosis: Secondary | ICD-10-CM

## 2023-03-25 LAB — COLOGUARD: COLOGUARD: POSITIVE — AB

## 2023-06-27 IMAGING — CT CT CARDIAC CORONARY ARTERY CALCIUM SCORE
3 series · 14 of 20 positions shown, 16 images · non-contrast
Comparison: None.
COMPARISON: None.

Addendum:
EXAM:
OVER-READ INTERPRETATION  CT CHEST

The following report is an over-read performed by radiologist Dr.
Dveinas Onute [REDACTED] on 01/05/2021. This
over-read does not include interpretation of cardiac or coronary
anatomy or pathology. The coronary calcium score interpretation by
the cardiologist is attached.
CLINICAL DATA: Risk stratification: 50 Year-old South Asian Male
Coronary Calcium Score
TECHNIQUE: The patient was scanned on a Siemens Force scanner. Axial
non-contrast 3 mm slices were carried out through the heart. The
data set was analyzed on a dedicated work station and scored using
the Agatson method.

[Series 2: cascseq 2.0 sa36 (id) (id) · axial · 0.39mm/px · z∈[-90,-10]mm · 4 of 68 slices shown]
[im 14/68  vessel]
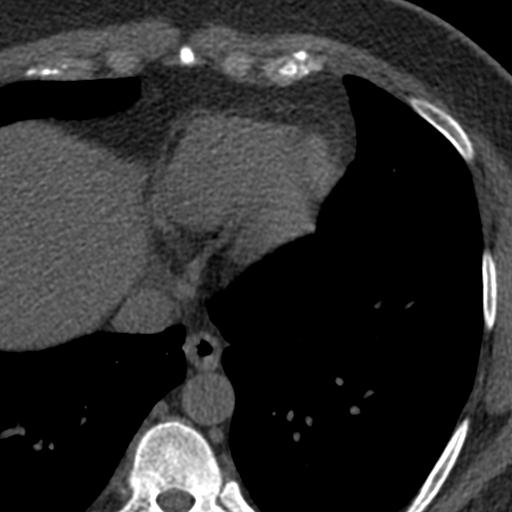
[im 27/68  vessel]
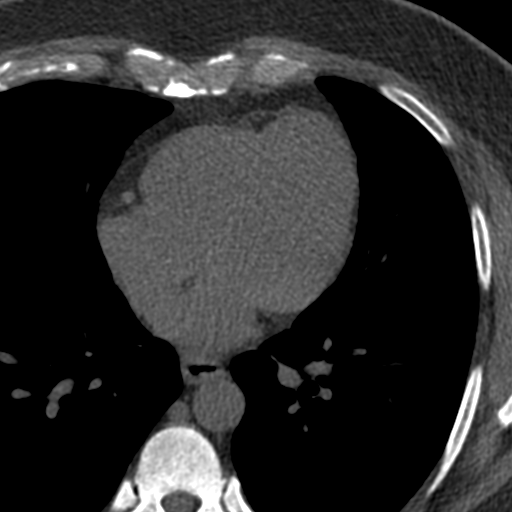
[im 41/68  vessel]
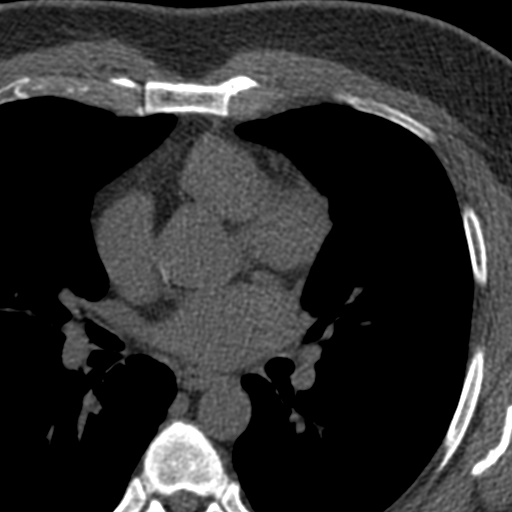
[im 54/68  vessel]
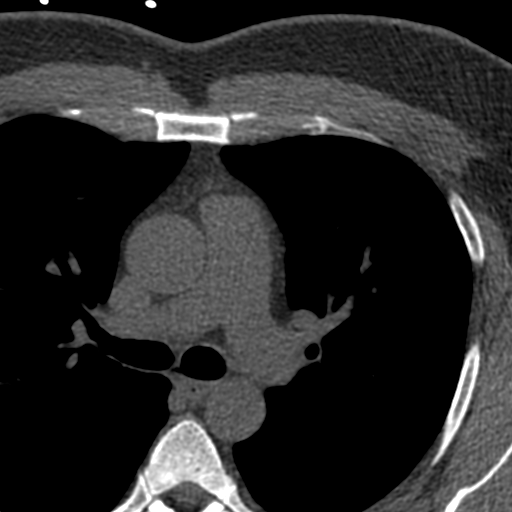

[Series 3: cascseq 2.0 bf37 st · axial · 0.70mm/px · z∈[-94,-6]mm · 5 of 68 slices shown, 7 images]
[im 12/68  vessel]
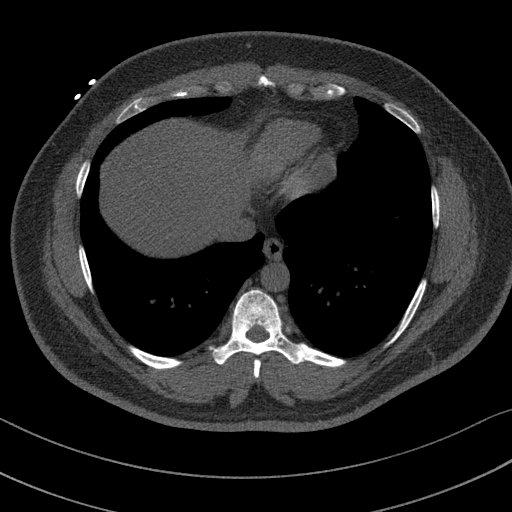
[im 12/68  lung]
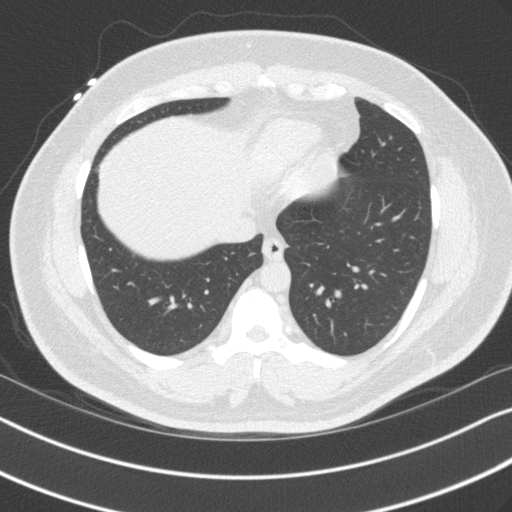
[im 23/68  vessel]
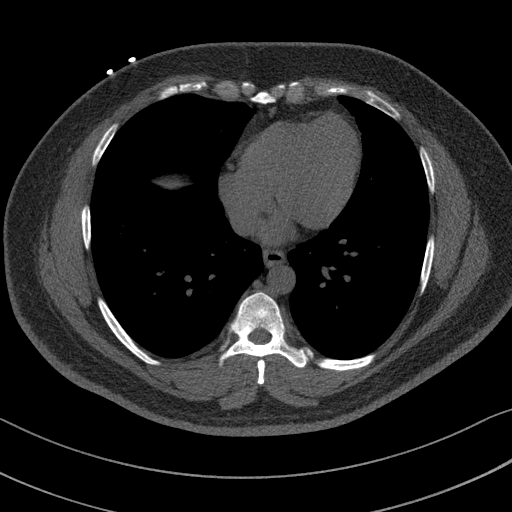
[im 34/68  vessel]
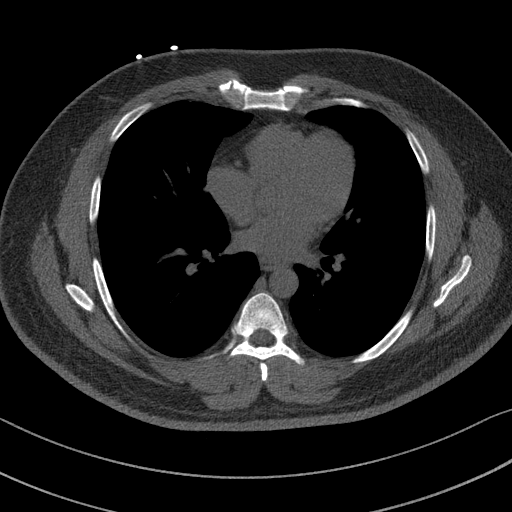
[im 45/68  vessel]
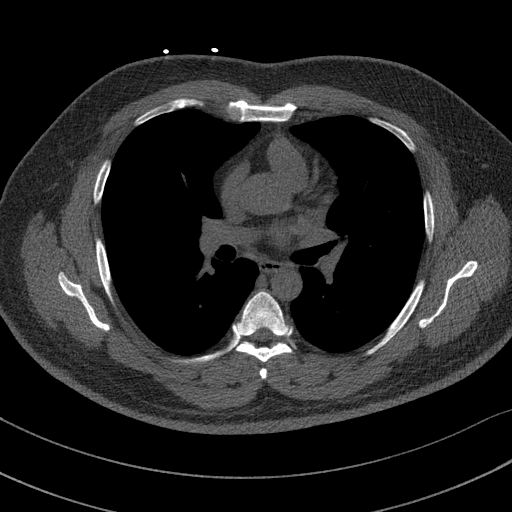
[im 56/68  vessel]
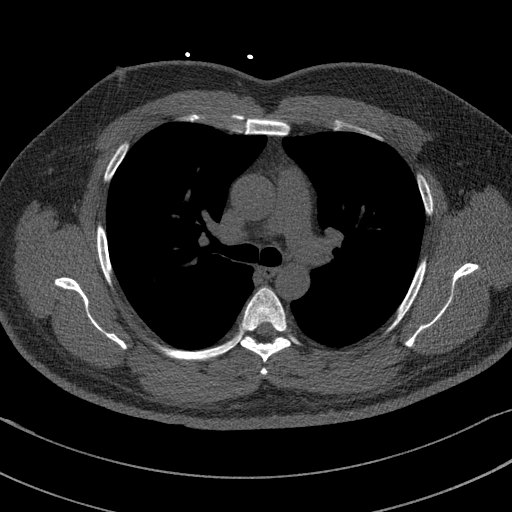
[im 56/68  lung]
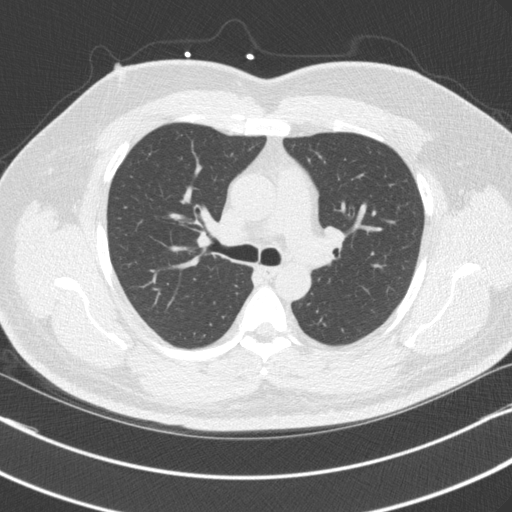

[Series 4: cascseq 2.0 br59 lung · axial · 0.70mm/px · z∈[-94,-6]mm · 5 of 68 slices shown]
[im 12/68  lung]
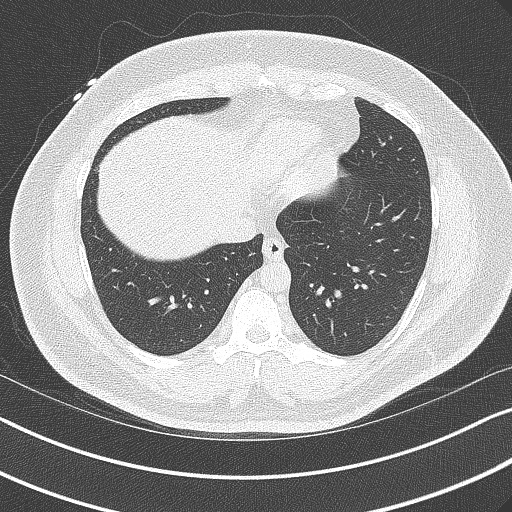
[im 23/68  lung]
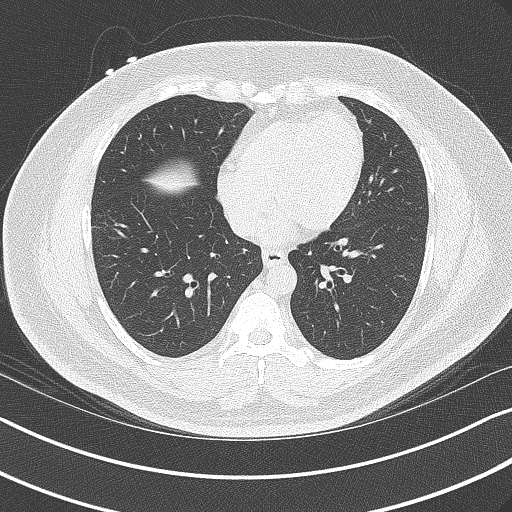
[im 34/68  lung]
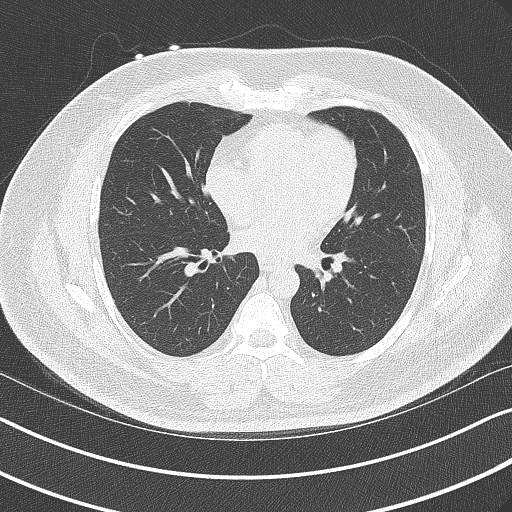
[im 45/68  lung]
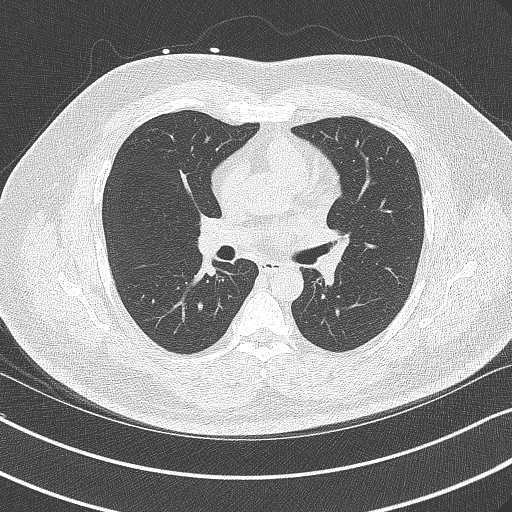
[im 56/68  lung]
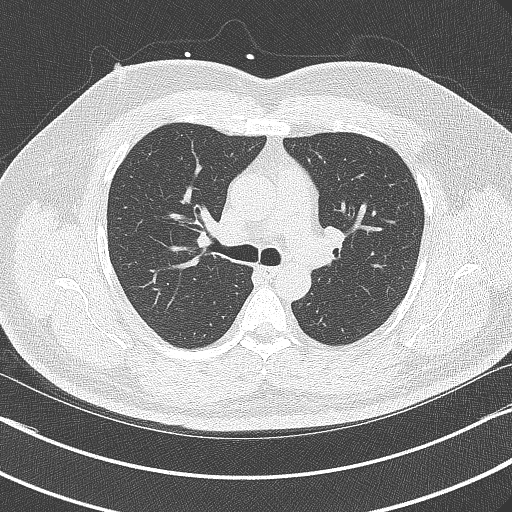

[14 of 20 positions shown; findings below may reference images not displayed]

FINDINGS: Atherosclerotic calcifications in the thoracic aorta. Within the
visualized portions of the thorax there are no suspicious appearing
pulmonary nodules or masses, there is no acute consolidative
airspace disease, no pleural effusions, no pneumothorax and no
lymphadenopathy. Visualized portions of the upper abdomen
demonstrate diffuse low attenuation throughout the visualized
hepatic parenchyma, indicative of hepatic steatosis. There are no
aggressive appearing lytic or blastic lesions noted in the
visualized portions of the skeleton.
IMPRESSION: 1.  Aortic Atherosclerosis ([id number]).
2. Hepatic steatosis.
FINDINGS: Non-cardiac: See separate report from [REDACTED].

Ascending Aorta: Normal caliber.  Aortic Atherosclerosis.

Pericardium: Normal.

Coronary arteries: Normal origins.

Coronary Calcium Score:

Left main: 0

Left anterior descending artery: 0

Left circumflex artery: 0

Right coronary artery: 0

Total: 0

Percentile: 1st for age, sex, and race matched control.
IMPRESSION: 1. Coronary calcium score of 0. This was 1st percentile for age,
gender, and race matched controls.

2.  Aortic atherosclerosis noted.

RECOMMENDATIONS:



If CAC = 0, it is reasonable to withhold statin therapy and reassess
in 5 to 10 years, as long as higher risk conditions are absent
(diabetes mellitus, family history of premature CHD in first degree
relatives (males <55 years; females <65 years), cigarette smoking,
LDL >=190 mg/dL or other independent risk factors).

If CAC is 1 to 99, it is reasonable to initiate statin therapy for
patients >=55 years of age.

If CAC is >=100 or >=75th percentile, it is reasonable to initiate
statin therapy at any age.

Cardiology referral should be considered for patients with CAC
scores =400 or >=75th percentile.

*5987 AHA/ACC/AACVPR/AAPA/ABC/LII/JOHN INGAR/AUNTYJATTY/Gustema/HAND/NAIIE/HON KEUNG
Guideline on the Management of Blood Cholesterol: A Report of the
American College of Cardiology/American Heart Association Task Force
on Clinical Practice Guidelines. J Am Coll Cardiol.
9050;73(24):1922-1055.

*** End of Addendum ***
EXAM:
OVER-READ INTERPRETATION  CT CHEST

The following report is an over-read performed by radiologist Dr.
Dveinas Onute [REDACTED] on 01/05/2021. This
over-read does not include interpretation of cardiac or coronary
anatomy or pathology. The coronary calcium score interpretation by
the cardiologist is attached.
FINDINGS: Atherosclerotic calcifications in the thoracic aorta. Within the
visualized portions of the thorax there are no suspicious appearing
pulmonary nodules or masses, there is no acute consolidative
airspace disease, no pleural effusions, no pneumothorax and no
lymphadenopathy. Visualized portions of the upper abdomen
demonstrate diffuse low attenuation throughout the visualized
hepatic parenchyma, indicative of hepatic steatosis. There are no
aggressive appearing lytic or blastic lesions noted in the
visualized portions of the skeleton.
IMPRESSION: 1.  Aortic Atherosclerosis ([id number]).
2. Hepatic steatosis.
